# Patient Record
Sex: Male | Born: 1945 | Race: Black or African American | Hispanic: No | Marital: Married | Smoking: Never smoker
Health system: Southern US, Community
[De-identification: ages and names within clinical notes are randomized; demographics above are authoritative.]

## PROBLEM LIST (undated history)

## (undated) DIAGNOSIS — N189 Chronic kidney disease, unspecified: Secondary | ICD-10-CM

## (undated) DIAGNOSIS — E785 Hyperlipidemia, unspecified: Secondary | ICD-10-CM

## (undated) DIAGNOSIS — C801 Malignant (primary) neoplasm, unspecified: Secondary | ICD-10-CM

## (undated) DIAGNOSIS — Z940 Kidney transplant status: Secondary | ICD-10-CM

## (undated) DIAGNOSIS — I10 Essential (primary) hypertension: Secondary | ICD-10-CM

## (undated) DIAGNOSIS — M1990 Unspecified osteoarthritis, unspecified site: Secondary | ICD-10-CM

## (undated) DIAGNOSIS — G4730 Sleep apnea, unspecified: Secondary | ICD-10-CM

## (undated) HISTORY — DX: Essential (primary) hypertension: I10

## (undated) HISTORY — DX: Hyperlipidemia, unspecified: E78.5

## (undated) HISTORY — DX: Chronic kidney disease, unspecified: N18.9

## (undated) HISTORY — PX: NEPHRECTOMY TRANSPLANTED ORGAN: SUR880

---

## 2000-10-14 DIAGNOSIS — R809 Proteinuria, unspecified: Secondary | ICD-10-CM | POA: Insufficient documentation

## 2007-03-20 ENCOUNTER — Other Ambulatory Visit: Payer: Self-pay

## 2007-03-20 ENCOUNTER — Emergency Department: Payer: Self-pay | Admitting: Unknown Physician Specialty

## 2008-03-26 ENCOUNTER — Other Ambulatory Visit: Payer: Self-pay

## 2008-03-26 ENCOUNTER — Ambulatory Visit: Payer: Self-pay | Admitting: Ophthalmology

## 2008-03-31 ENCOUNTER — Ambulatory Visit: Payer: Self-pay | Admitting: Ophthalmology

## 2009-08-13 ENCOUNTER — Ambulatory Visit: Payer: Self-pay | Admitting: Radiation Oncology

## 2009-09-02 ENCOUNTER — Ambulatory Visit: Payer: Self-pay | Admitting: Specialist

## 2009-09-05 ENCOUNTER — Ambulatory Visit: Payer: Self-pay | Admitting: Specialist

## 2009-09-12 ENCOUNTER — Ambulatory Visit: Payer: Self-pay | Admitting: Radiation Oncology

## 2009-09-13 ENCOUNTER — Ambulatory Visit: Payer: Self-pay | Admitting: Radiation Oncology

## 2009-09-14 ENCOUNTER — Ambulatory Visit: Payer: Self-pay | Admitting: Radiation Oncology

## 2009-10-11 ENCOUNTER — Ambulatory Visit: Payer: Self-pay | Admitting: Radiation Oncology

## 2009-11-11 ENCOUNTER — Ambulatory Visit: Payer: Self-pay | Admitting: Radiation Oncology

## 2009-12-11 ENCOUNTER — Ambulatory Visit: Payer: Self-pay | Admitting: Radiation Oncology

## 2010-01-11 ENCOUNTER — Ambulatory Visit: Payer: Self-pay | Admitting: Radiation Oncology

## 2010-03-13 ENCOUNTER — Ambulatory Visit: Payer: Self-pay | Admitting: Radiation Oncology

## 2010-04-12 ENCOUNTER — Ambulatory Visit: Payer: Self-pay | Admitting: Radiation Oncology

## 2010-04-13 ENCOUNTER — Ambulatory Visit: Payer: Self-pay | Admitting: Radiation Oncology

## 2010-04-13 LAB — PSA

## 2011-01-03 DIAGNOSIS — N186 End stage renal disease: Secondary | ICD-10-CM | POA: Insufficient documentation

## 2011-03-21 DIAGNOSIS — E042 Nontoxic multinodular goiter: Secondary | ICD-10-CM | POA: Insufficient documentation

## 2011-05-25 DIAGNOSIS — K5901 Slow transit constipation: Secondary | ICD-10-CM | POA: Insufficient documentation

## 2012-09-29 DIAGNOSIS — Z905 Acquired absence of kidney: Secondary | ICD-10-CM | POA: Insufficient documentation

## 2012-09-29 DIAGNOSIS — I770 Arteriovenous fistula, acquired: Secondary | ICD-10-CM | POA: Insufficient documentation

## 2012-09-29 DIAGNOSIS — N051 Unspecified nephritic syndrome with focal and segmental glomerular lesions: Secondary | ICD-10-CM | POA: Insufficient documentation

## 2012-09-29 DIAGNOSIS — C61 Malignant neoplasm of prostate: Secondary | ICD-10-CM | POA: Insufficient documentation

## 2013-04-30 DIAGNOSIS — I716 Thoracoabdominal aortic aneurysm, without rupture, unspecified: Secondary | ICD-10-CM | POA: Insufficient documentation

## 2013-05-25 DIAGNOSIS — Z8546 Personal history of malignant neoplasm of prostate: Secondary | ICD-10-CM | POA: Insufficient documentation

## 2013-12-11 DIAGNOSIS — D899 Disorder involving the immune mechanism, unspecified: Secondary | ICD-10-CM

## 2013-12-11 DIAGNOSIS — D849 Immunodeficiency, unspecified: Secondary | ICD-10-CM | POA: Insufficient documentation

## 2014-01-25 DIAGNOSIS — E876 Hypokalemia: Secondary | ICD-10-CM | POA: Insufficient documentation

## 2014-03-08 DIAGNOSIS — Z79899 Other long term (current) drug therapy: Secondary | ICD-10-CM | POA: Insufficient documentation

## 2014-03-08 DIAGNOSIS — Z940 Kidney transplant status: Secondary | ICD-10-CM

## 2014-05-03 DIAGNOSIS — R3129 Other microscopic hematuria: Secondary | ICD-10-CM | POA: Insufficient documentation

## 2014-05-10 DIAGNOSIS — E663 Overweight: Secondary | ICD-10-CM | POA: Insufficient documentation

## 2014-05-10 DIAGNOSIS — D631 Anemia in chronic kidney disease: Secondary | ICD-10-CM | POA: Insufficient documentation

## 2014-05-10 DIAGNOSIS — N189 Chronic kidney disease, unspecified: Secondary | ICD-10-CM

## 2014-06-08 DIAGNOSIS — I151 Hypertension secondary to other renal disorders: Secondary | ICD-10-CM | POA: Insufficient documentation

## 2015-11-30 DIAGNOSIS — Z4822 Encounter for aftercare following kidney transplant: Secondary | ICD-10-CM | POA: Insufficient documentation

## 2016-06-11 DIAGNOSIS — I214 Non-ST elevation (NSTEMI) myocardial infarction: Secondary | ICD-10-CM | POA: Insufficient documentation

## 2016-06-11 DIAGNOSIS — I3139 Other pericardial effusion (noninflammatory): Secondary | ICD-10-CM | POA: Insufficient documentation

## 2016-06-11 DIAGNOSIS — R0902 Hypoxemia: Secondary | ICD-10-CM | POA: Insufficient documentation

## 2016-06-11 DIAGNOSIS — I313 Pericardial effusion (noninflammatory): Secondary | ICD-10-CM | POA: Insufficient documentation

## 2016-11-29 DIAGNOSIS — R0609 Other forms of dyspnea: Secondary | ICD-10-CM | POA: Insufficient documentation

## 2017-03-18 DIAGNOSIS — E7800 Pure hypercholesterolemia, unspecified: Secondary | ICD-10-CM | POA: Insufficient documentation

## 2017-03-18 DIAGNOSIS — D638 Anemia in other chronic diseases classified elsewhere: Secondary | ICD-10-CM | POA: Insufficient documentation

## 2017-03-18 DIAGNOSIS — E538 Deficiency of other specified B group vitamins: Secondary | ICD-10-CM | POA: Insufficient documentation

## 2017-03-18 DIAGNOSIS — E46 Unspecified protein-calorie malnutrition: Secondary | ICD-10-CM | POA: Insufficient documentation

## 2017-05-09 ENCOUNTER — Other Ambulatory Visit: Payer: Self-pay | Admitting: Unknown Physician Specialty

## 2017-05-09 DIAGNOSIS — R490 Dysphonia: Secondary | ICD-10-CM

## 2017-06-03 ENCOUNTER — Ambulatory Visit
Admission: RE | Admit: 2017-06-03 | Discharge: 2017-06-03 | Disposition: A | Discharge status: Home / Self Care | Payer: Medicare Other | Source: Ambulatory Visit | Attending: Unknown Physician Specialty | Admitting: Unknown Physician Specialty

## 2017-06-03 DIAGNOSIS — R490 Dysphonia: Secondary | ICD-10-CM | POA: Diagnosis not present

## 2017-06-03 DIAGNOSIS — R1312 Dysphagia, oropharyngeal phase: Secondary | ICD-10-CM

## 2017-06-03 NOTE — Therapy (Signed)
[place], [place], [address] Phone: [phone]   Fax:     Modified Barium Swallow  Patient Details  Name: Jay Mora MRN: [id number] Date of Birth: Jan 08, 1946 No Data Recorded  Encounter Date: 2017-06-23      End of Session - June 23, 2017 1318    Visit Number 1   Number of Visits 1   Date for SLP Re-Evaluation 06-23-2017   SLP Start Time 4742   SLP Stop Time  1315   SLP Time Calculation (min) 60 min   Activity Tolerance Patient tolerated treatment well      No past medical history on file.  No past surgical history on file.  There were no vitals filed for this visit.     Subjective: Patient behavior: (alertness, ability to follow instructions, etc.): Patient is able to follow directions and explain his swallowing complaints  Chief complaint: shortness of breath, episodes of liquid "going into the windpipe"    Objective:  Radiological Procedure: A videoflouroscopic evaluation of oral-preparatory, reflex initiation, and pharyngeal phases of the swallow was performed; as well as a screening of the upper esophageal phase.  I. POSTURE: Upright in MBS chair  II. VIEW: Lateral  III. COMPENSATORY STRATEGIES: N/A  IV. BOLUSES ADMINISTERED:   Thin Liquid: 2 cup rim, 4 rapid consecutive   Nectar-thick Liquid: 1 moderate   Honey-thick Liquid: DNT   Puree: 2 teaspoon presentations   Mechanical Soft: 1/4 graham cracker in applesauce  V. RESULTS OF EVALUATION: A. ORAL PREPARATORY PHASE: (The lips, tongue, and velum are observed for strength and coordination)       **Overall Severity Rating: Within normal limits  B. SWALLOW INITIATION/REFLEX: (The reflex is normal if "triggered" by the time the bolus reached the base of the tongue)  **Overall Severity Rating: Mild; triggers at the valleculae  C. PHARYNGEAL PHASE: (Pharyngeal function is normal if the bolus shows rapid, smooth, and  continuous transit through the pharynx and there is no pharyngeal residue after the swallow)  **Overall Severity Rating: Within normal limits  D. LARYNGEAL PENETRATION: (Material entering into the laryngeal inlet/vestibule but not aspirated) None  E. ASPIRATION: None  F. ESOPHAGEAL PHASE: (Screening of the upper esophagus) No observed abnormality within the viewable cervical esophagus  ASSESSMENT: This 71 year old man; with concern for aspiration; is presenting with minimal oropharyngeal dysphagia characterized by mildly delayed pharyngeal swallow initiation.  Oral control of the bolus including oral hold, rotary mastication, and anterior to posterior transfer are within normal limits.  Aspects of the pharyngeal stage of swallowing including tongue base retraction, hyolaryngeal excursion, epiglottic inversion, and duration/amplitude of UES opening are within normal limits.  There is no observed pharyngeal residue, laryngeal penetration, or tracheal aspiration.  The patient's difficulties do not appear to be due to oropharyngeal swallow function.    PLAN/RECOMMENDATIONS:   A. Diet: Regular/usual diet   B. Swallowing Precautions: No special precautions   C. Recommended consultation to: follow up with physicians as recommended   D. Therapy recommendations: N/A speech therapy is not indicated   E. Results and recommendations were discussed with the patient immediately following the study and the final report routed to the referring MD.   Dysphagia, oropharyngeal phase  Hoarseness - Plan: DG OP Swallowing Func-Medicare/Speech Path, DG OP Swallowing Func-Medicare/Speech Path      G-Codes - 2017-06-23 1319    Functional Assessment Tool Used MBSS, clinical judgment   Functional Limitations Swallowing   Swallow Current  Status [phone]) At least 1 percent but less than 20 percent impaired, limited or restricted   Swallow Goal Status (J5872) At least 1 percent but less than 20 percent impaired,  limited or restricted   Swallow Discharge Status [phone]) At least 1 percent but less than 20 percent impaired, limited or restricted          Problem List There are no active problems to display for this patient.  Jay Sea, MS/CCC- SLP  Jay Mora 06/03/2017, 1:20 PM  [facility] [place], [place], [address] Phone: [phone]   Fax:     Name: Jay Mora MRN: [id number] Date of Birth: [birth year]

## 2018-03-31 DIAGNOSIS — G4733 Obstructive sleep apnea (adult) (pediatric): Secondary | ICD-10-CM | POA: Insufficient documentation

## 2018-05-02 ENCOUNTER — Encounter: Payer: Self-pay | Admitting: *Deleted

## 2018-05-05 ENCOUNTER — Emergency Department: Payer: Medicare Other

## 2018-05-05 ENCOUNTER — Other Ambulatory Visit: Payer: Self-pay

## 2018-05-05 ENCOUNTER — Ambulatory Visit (HOSPITAL_COMMUNITY)
Admission: AD | Admit: 2018-05-05 | Discharge: 2018-05-05 | Disposition: A | Discharge status: Home / Self Care | Payer: Medicare Other | Source: Other Acute Inpatient Hospital | Attending: Emergency Medicine | Admitting: Emergency Medicine

## 2018-05-05 ENCOUNTER — Encounter: Payer: Self-pay | Admitting: Emergency Medicine

## 2018-05-05 ENCOUNTER — Ambulatory Visit (INDEPENDENT_AMBULATORY_CARE_PROVIDER_SITE_OTHER): Payer: Medicare Other | Admitting: Cardiothoracic Surgery

## 2018-05-05 ENCOUNTER — Encounter: Payer: Self-pay | Admitting: Cardiothoracic Surgery

## 2018-05-05 ENCOUNTER — Emergency Department
Admission: EM | Admit: 2018-05-05 | Discharge: 2018-05-05 | Disposition: A | Discharge status: Other Acute Inpatient Hospital | Payer: Medicare Other | Attending: Emergency Medicine | Admitting: Emergency Medicine

## 2018-05-05 VITALS — BP 200/100 | HR 64 | Temp 97.7°F | Ht 70.0 in | Wt 182.6 lb

## 2018-05-05 DIAGNOSIS — Z8546 Personal history of malignant neoplasm of prostate: Secondary | ICD-10-CM | POA: Diagnosis not present

## 2018-05-05 DIAGNOSIS — I313 Pericardial effusion (noninflammatory): Secondary | ICD-10-CM | POA: Insufficient documentation

## 2018-05-05 DIAGNOSIS — I132 Hypertensive heart and chronic kidney disease with heart failure and with stage 5 chronic kidney disease, or end stage renal disease: Secondary | ICD-10-CM | POA: Diagnosis not present

## 2018-05-05 DIAGNOSIS — J9601 Acute respiratory failure with hypoxia: Secondary | ICD-10-CM

## 2018-05-05 DIAGNOSIS — R0602 Shortness of breath: Secondary | ICD-10-CM | POA: Diagnosis present

## 2018-05-05 DIAGNOSIS — R0902 Hypoxemia: Secondary | ICD-10-CM

## 2018-05-05 DIAGNOSIS — I7103 Dissection of thoracoabdominal aorta: Secondary | ICD-10-CM | POA: Diagnosis not present

## 2018-05-05 DIAGNOSIS — I3139 Other pericardial effusion (noninflammatory): Secondary | ICD-10-CM

## 2018-05-05 DIAGNOSIS — I509 Heart failure, unspecified: Secondary | ICD-10-CM | POA: Insufficient documentation

## 2018-05-05 DIAGNOSIS — N186 End stage renal disease: Secondary | ICD-10-CM | POA: Insufficient documentation

## 2018-05-05 DIAGNOSIS — Z940 Kidney transplant status: Secondary | ICD-10-CM | POA: Insufficient documentation

## 2018-05-05 HISTORY — DX: Kidney transplant status: Z94.0

## 2018-05-05 LAB — CBC
HEMATOCRIT: 40.6 % (ref 40.0–52.0)
Hemoglobin: 13.1 g/dL (ref 13.0–18.0)
MCH: 22.9 pg — ABNORMAL LOW (ref 26.0–34.0)
MCHC: 32.4 g/dL (ref 32.0–36.0)
MCV: 70.6 fL — AB (ref 80.0–100.0)
PLATELETS: 171 10*3/uL (ref 150–440)
RBC: 5.74 MIL/uL (ref 4.40–5.90)
RDW: 17.6 % — AB (ref 11.5–14.5)
WBC: 5 10*3/uL (ref 3.8–10.6)

## 2018-05-05 LAB — BASIC METABOLIC PANEL
Anion gap: 5 (ref 5–15)
BUN: 22 mg/dL (ref 8–23)
CHLORIDE: 114 mmol/L — AB (ref 98–111)
CO2: 25 mmol/L (ref 22–32)
Calcium: 9.3 mg/dL (ref 8.9–10.3)
Creatinine, Ser: 1.17 mg/dL (ref 0.61–1.24)
GFR calc Af Amer: >60 mL/min (ref >60)
GLUCOSE: 99 mg/dL (ref 70–99)
POTASSIUM: 3.7 mmol/L (ref 3.5–5.1)
SODIUM: 144 mmol/L (ref 135–145)

## 2018-05-05 LAB — TROPONIN I
Troponin I: 0.03 ng/mL (ref <0.03)
Troponin I: 0.04 ng/mL (ref <0.03)
Troponin I: 0.04 ng/mL (ref <0.03)

## 2018-05-05 LAB — BRAIN NATRIURETIC PEPTIDE: B Natriuretic Peptide: 4268 pg/mL — ABNORMAL HIGH (ref 0.0–100.0)

## 2018-05-05 MED ORDER — IOHEXOL 350 MG/ML SOLN
75.0000 mL | Freq: Once | INTRAVENOUS | Status: AC | PRN
  Administered 2018-05-05: 75 mL via INTRAVENOUS

## 2018-05-05 MED ORDER — SODIUM CHLORIDE 0.9 % IV BOLUS: 500.0000 mL | Freq: Once | INTRAVENOUS | Status: DC

## 2018-05-05 MED ORDER — FUROSEMIDE 10 MG/ML IJ SOLN
40.0000 mg | Freq: Once | INTRAMUSCULAR | Status: AC
  Administered 2018-05-05: 40 mg via INTRAVENOUS
  Filled 2018-05-05: qty 4

## 2018-05-05 MED ORDER — TACROLIMUS 1 MG PO CAPS
1.0000 mg | ORAL_CAPSULE | Freq: Two times a day (BID) | ORAL | Status: DC
  Administered 2018-05-05: 1 mg via ORAL
  Filled 2018-05-05: qty 1

## 2018-05-05 MED ORDER — LABETALOL HCL 5 MG/ML IV SOLN
10.0000 mg | Freq: Once | INTRAVENOUS | Status: AC
  Administered 2018-05-05: 10 mg via INTRAVENOUS
  Filled 2018-05-05: qty 4

## 2018-05-05 MED ORDER — SODIUM CHLORIDE 0.9 % IV BOLUS
500.0000 mL | Freq: Once | INTRAVENOUS | Status: AC
  Administered 2018-05-05: 500 mL via INTRAVENOUS

## 2018-05-05 MED ORDER — MYCOPHENOLATE SODIUM 180 MG PO TBEC
180.0000 mg | DELAYED_RELEASE_TABLET | Freq: Once | ORAL | Status: AC
  Administered 2018-05-05: 180 mg via ORAL
  Filled 2018-05-05: qty 1

## 2018-05-05 MED ORDER — ESMOLOL HCL-SODIUM CHLORIDE 2000 MG/100ML IV SOLN
25.0000 ug/kg/min | Freq: Once | INTRAVENOUS | Status: AC
  Administered 2018-05-05: 25 ug/kg/min via INTRAVENOUS
  Filled 2018-05-05: qty 100

## 2018-05-05 NOTE — ED Notes (Signed)
Called [facility] transfer center spoke to [place]

## 2018-05-05 NOTE — ED Notes (Signed)
Pt is resting in bed. Family at bedside.

## 2018-05-05 NOTE — Progress Notes (Signed)
Patient ID: Jay Mora, male   DOB: [birth year], 72 y.o.   MRN: [id number]  Chief Complaint  Patient presents with  . New Patient (Initial Visit)    Pericardial effusion    Referred By Dr. Ginette Pitman Reason for Referral pericardial effusion  HPI Location, Quality, Duration, Severity, Timing, Context, Modifying Factors, Associated Signs and Symptoms.  Jay Mora is a 72 y.o. male.  He is a 72 year old African-American male who is 4 years out from a right iliac fossa cadaveric kidney transplant at [facility]  He receives most of his care at [facility] at the nephrology department and has undergone bilateral nephrectomies for hypertension.  The patient has been disappointed with the care that he is received at various locations not because of quality but because of inconvenience and he was like to concentrate his care here at [facility].  As such he would like to have his records forwarded to [place].  In our system there is little information available for my review.  There are no imaging studies including echocardiography or CT scanning.  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  He does not get short of breath although he has been noticing increasing bilateral lower extremity edema.  This is been going on for several months.  He is not short of breath when he lies flat.  Apparently the patient had a history of a pericardial effusion although I am not sure how that diagnosis was made and neither is the patient.   Past Medical History:  Diagnosis Date  . Chronic kidney disease   . Hyperlipidemia   . Hypertension      No family history on file.  Social History Social History   Tobacco Use  . Smoking status: Never Smoker  . Smokeless tobacco: Never Used  Substance Use Topics  . Alcohol use: Not Currently  . Drug use: Never     Allergies  Allergen Reactions  . Shellfish Allergy Swelling    Other reaction(s): SWELLING Other reaction(s): SWELLING   . Minoxidil Nausea Only, Other (See Comments) and Rash    Makes patient really tired per his reports. Extreme fatigue Makes patient really tired per his reports. Extreme fatigue     Current Outpatient Medications  Medication Sig Dispense Refill  . Cholecalciferol 1000 units capsule Take by mouth.    . ferrous sulfate 325 (65 FE) MG tablet Take by mouth.    Marland Kitchen albuterol (PROVENTIL HFA;VENTOLIN HFA) 108 (90 Base) MCG/ACT inhaler Inhale into the lungs.    Marland Kitchen aspirin EC 81 MG tablet Take by mouth.    Marland Kitchen atorvastatin (LIPITOR) 20 MG tablet Take by mouth.    Marland Kitchen atorvastatin (LIPITOR) 40 MG tablet Take by mouth.    . cloNIDine (CATAPRES - DOSED IN MG/24 HR) 0.3 mg/24hr patch Place onto the skin.    Marland Kitchen docusate sodium (COLACE) 100 MG capsule Take by mouth.    . furosemide (LASIX) 40 MG tablet Take by mouth.    . hydrALAZINE (APRESOLINE) 100 MG tablet Take by mouth.    . isosorbide mononitrate (IMDUR) 60 MG 24 hr tablet Take by mouth.    . labetalol (NORMODYNE) 100 MG tablet Take by mouth.    Marland Kitchen lisinopril (PRINIVIL,ZESTRIL) 20 MG tablet Take by mouth.    Marland Kitchen lisinopril (PRINIVIL,ZESTRIL) 40 MG tablet Take by mouth.    Marland Kitchen  mycophenolate (MYFORTIC) 180 MG EC tablet Take by mouth.    . polyethylene glycol powder (GLYCOLAX/MIRALAX) powder Take by mouth.    . potassium chloride (K-DUR) 10 MEQ tablet Take by mouth.    . potassium chloride SA (K-DUR,KLOR-CON) 20 MEQ tablet Take by mouth.    . sertraline (ZOLOFT) 25 MG tablet Take by mouth.    . tacrolimus (PROGRAF) 1 MG capsule Take by mouth.    . vitamin B-12 (CYANOCOBALAMIN) 1000 MCG tablet Take by mouth.     No current facility-administered medications for this visit.       Review of Systems A complete review of systems was asked and was negative except for the following positive findings weight loss, loss of sleep,  fatigue, shortness of breath with exertion.  Blood pressure (!) 200/100, pulse 64, temperature 97.7 F (36.5 C), temperature source Skin, height 5\' 10"  (1.778 m), weight 182 lb 9.6 oz (82.8 kg), SpO2 (!) 84 %.  Physical Exam CONSTITUTIONAL:  Pleasant, well-developed, well-nourished, and in no acute distress. EYES: Pupils equal and reactive to light, Sclera non-icteric EARS, NOSE, MOUTH AND THROAT:  The oropharynx was clear.  Dentition is good repair.  Oral mucosa pink and moist. LYMPH NODES:  Lymph nodes in the neck and axillae were normal RESPIRATORY:  Lungs were clear.  Normal respiratory effort without pathologic use of accessory muscles of respiration CARDIOVASCULAR: Heart was regular without murmurs.  There were no carotid bruits.  Heart sounds were crisp.   GI: The abdomen was soft, nontender, and nondistended. There were no palpable masses. There was no hepatosplenomegaly. There were normal bowel sounds in all quadrants. GU:  Rectal deferred.   MUSCULOSKELETAL:  Normal muscle strength and tone.  No clubbing or cyanosis.  There is extensive lower extremity edema.  There is some distention of the external jugular veins but in the presence of the fistulae is unclear the significance of this. SKIN:  There were no pathologic skin lesions.  There were no nodules on palpation. NEUROLOGIC:  Sensation is normal.  Cranial nerves are grossly intact. PSYCH:  Oriented to person, place and time.  Mood and affect are normal.  Data Reviewed None  I have personally reviewed the patient's imaging, laboratory findings and medical records.    Assessment    During our evaluation of the patient his oxygen saturations were recorded at 84 to 85%.  I explained this to the patient and he stated that he was trying to be calm and not breathe deeply because of his hypertension which she wished to be controlled.  He then made several vigorous attempts at coughing and deep breathing and indeed his saturations did  improve into the mid 90s but over the course of the next minute or 2 fell back into the low 90s.  I did discuss his care with Dr. Gustavus Bryant today.  I explained that I did not have any information to work with regarding echocardiography or CT scanning but that the patient appeared to be clinically well.    Plan    After an extensive discussion with Dr. Gustavus Bryant and the patient I have recommended that he present to the emergency room for further evaluation.  I am concerned that his oxygen saturations cannot be maintained higher than 90%.  The patient tells me that his wife is present in the car with him and cannot come in nor can she drive home as she has had recent foot surgery.  Therefore he is going to leave our office despite our  recommendations.  I did talk to Dr. Gustavus Bryant today and he recommended perhaps getting social services involved but the patient has requested to leave.  He will present to our emergency department later after he takes his wife home.  Perhaps we can then evaluate his underlying issues.      Nestor Lewandowsky, MD 05/05/2018, 10:43 AM

## 2018-05-05 NOTE — ED Notes (Signed)
Report off to brittnay rn

## 2018-05-05 NOTE — ED Notes (Signed)
Pt is going to medial imaging.

## 2018-05-05 NOTE — Patient Instructions (Addendum)
We recommend for you to go to the ED after dropping off your wife.  We will gather most of your records for [place] to review. Please sign release of information at the front so we could gather your information.

## 2018-05-05 NOTE — ED Notes (Signed)
EMTALA and Medical Necessity documentation reviewed at this time and complete per policy.

## 2018-05-05 NOTE — ED Notes (Signed)
[facility] is here to transfer patient to [facility].

## 2018-05-05 NOTE — ED Notes (Signed)
AAOx3.  Skin warm and dry.  No SOB/ DOE.   

## 2018-05-05 NOTE — ED Triage Notes (Signed)
Has been short of breath for months.  Says also increased swllling in legs.  He appeared short of breat after walking in.  Better at rest.  Was sent from dr office and t hey wanted to send ems, but pt refused.

## 2018-05-05 NOTE — ED Notes (Addendum)
Pt was sent from doctor's office with low oxygen sats.  Oxygen sats in the 8-0's on room air.  Pt placed on 3 liters with sats 94%.  Pt reports no chest pain.  States intermittent sob.  Hx dialysis and kidney transplant. Blood pressure elevated.  Pt took bp meds today.   No n/v/d  Pt alert.  nsr on monitor with occ pvc's

## 2018-05-05 NOTE — ED Notes (Signed)
ED Provider at bedside. 

## 2018-05-05 NOTE — ED Provider Notes (Addendum)
[facility] Emergency Department Provider Note  ____________________________________________  Time seen: Approximately 6:39 PM  I have reviewed the triage vital signs and the nursing notes.   HISTORY  Chief Complaint Shortness of Breath    HPI Jay Mora is a 72 y.o. male status post renal transplant 2015, HTN, HL, known pericardial effusion, presenting for hypoxia.  The patient reports that for the last several years he has had progressively worsening shortness of breath, especially with exertion.  Over the last 3 weeks, he has also noted new lower extremity edema that has been treated with Lasix although he continues to have swelling.  ----------------------------------------- 6:56 PM on 05/05/2018 -----------------------------------------  I reviewed the patient's medical chart at [facility], and it appears that he has had a known moderate to large pericardial effusion for years, with prior echocardiogram showing large effusion without tamponade physiology.  Last echocardiogram was performed 5/19.   Past Medical History:  Diagnosis Date  . Chronic kidney disease   . Hyperlipidemia   . Hypertension   . Kidney transplanted     Patient Active Problem List   Diagnosis Date Noted  . Obstructive sleep apnea 03/31/2018  . Pure hypercholesterolemia 03/18/2017  . Protein deficiency ([place]) 03/18/2017  . Low serum vitamin B12 03/18/2017  . Anemia of chronic disease 03/18/2017  . DOE (dyspnea on exertion) 11/29/2016  . Pericardial effusion 06/11/2016  . NSTEMI (non-ST elevated myocardial infarction) ([place]) 06/11/2016  . Hypoxia 06/11/2016  . Encounter for aftercare following kidney transplant 11/30/2015  . 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 Overweight (BMI 25.0-29.9) 05/10/2014  . Anemia of renal disease 05/10/2014  . Hematuria, microscopic 05/03/2014  . Immunosuppressive management encounter following kidney transplant 03/08/2014  .  Hypomagnesemia 01/25/2014  . Chronic hypokalemia 01/25/2014  . Hypophosphatemia 12/22/2013  . Immunosuppression ([place]) 12/11/2013  . Personal history of prostate cancer 05/25/2013  . 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 History of nephrectomy 09/29/2012  . 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 CA of prostate ([place]) 09/29/2012  . Arteriovenous fistula ([place]) 09/29/2012  . Slow transit constipation 05/25/2011  . 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 End-stage renal disease ([place]) 01/03/2011  . 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 Proteinuria 10/14/2000  . Benign essential hypertension 10/14/2000    Past Surgical History:  Procedure Laterality Date  . NEPHRECTOMY TRANSPLANTED ORGAN      Current Outpatient Rx  . Order #: [id number] Class: Historical Med  . Order #: [id number] Class: Historical Med  . Order #: [id number] Class: Historical Med  . Order #: [id number] Class: Historical Med  . Order #: [id number] Class: Historical Med  . Order #: [id number] Class: Historical Med  . Order #: [id number] Class: Historical Med  . Order #: [id number] Class: Historical Med  . Order #: [id number] Class: Historical Med  . Order #: [id number] Class: Historical Med  . Order #: [id number] Class: Historical Med  . Order #: [id number] Class: Historical Med  . Order #: [id number] Class: Historical Med  . Order #: [id number] Class: Historical Med  . Order #: [id number] Class: Historical Med  . Order #: [id number] Class: Historical Med  . Order #: [id number] Class: Historical Med  . Order #: [id number] Class: Historical Med  . Order #: [id number] Class: Historical Med  . Order #: [id number] Class: Historical Med  . Order #: [id number] Class: Historical Med    Allergies Shellfish allergy and Minoxidil  No family history on file.  Social History Social History   Tobacco Use  . Smoking status: Never Smoker  . Smokeless tobacco: Never Used  Substance  Use Topics  . Alcohol  use: Not Currently  . Drug use: Never    Review of Systems Constitutional: No fever/chills.  No lightheadedness or syncope.  Positive decreased exercise tolerance. Eyes: No visual changes. ENT: No sore throat. No congestion or rhinorrhea. Cardiovascular: Denies chest pain. Denies palpitations. Respiratory: Positive hypoxia with shortness of breath, worse with exertion..  No cough. Gastrointestinal: No abdominal pain.  No nausea, no vomiting.  No diarrhea.  No constipation. Genitourinary: Negative for dysuria. Musculoskeletal: Negative for back pain.  Positive bilateral lower extremity swelling. Skin: Negative for rash. Neurological: Negative for headaches. No focal numbness, tingling or weakness.     ____________________________________________   PHYSICAL EXAM:  VITAL SIGNS: ED Triage Vitals  Enc Vitals Group     BP 05/05/18 1343 (!) 180/80     Pulse Rate 05/05/18 1335 68     Resp 05/05/18 1738 18     Temp 05/05/18 1335 97.8 F (36.6 C)     Temp Source 05/05/18 1738 Oral     SpO2 05/05/18 1335 93 %     Weight --      Height --      Head Circumference --      Peak Flow --      Pain Score 05/05/18 1344 0     Pain Loc --      Pain Edu? --      Excl. in [place]? --     Constitutional: Alert and oriented.  Answers questions appropriately.  Chronically ill-appearing. Eyes: Conjunctivae are normal.  EOMI. No scleral icterus. Head: Atraumatic. Nose: No congestion/rhinnorhea. Mouth/Throat: Mucous membranes are moist.  Neck: No stridor.  Supple.  Positive JVD.  No meningismus. Cardiovascular: Normal rate, regular rhythm.  Holosystolic murmur, without rubs or gallops.  Respiratory: Mildly tachypneic without accessory muscle use or retractions.  O2 sats are in the high 80s on room air on my examination; he does have O2 sats in the mid 90s when I placed his oxygen back at 3-1/2 L nasal cannula.  There are no wheezes rales or rhonchi on my exam.  The patient is able to speak in 3-4  word sentences and is winded after that. Gastrointestinal: Soft, nontender and nondistended.  No guarding or rebound.  No peritoneal signs. Musculoskeletal: Positive bilateral symmetric LE edema. No ttp in the calves or palpable cords.  Negative Homan's sign. Neurologic:  A&Ox3.  Speech is clear.  Face and smile are symmetric.  EOMI.  Moves all extremities well. Skin:  Skin is warm, dry and intact. No rash noted. Psychiatric: Mood and affect are normal. Speech and behavior are normal.  Normal judgement.  ____________________________________________   LABS (all labs ordered are listed, but only abnormal results are displayed)  Labs Reviewed  BASIC METABOLIC PANEL - Abnormal; Notable for the following components:      Result Value   Chloride 114 (*)    All other components within normal limits  CBC - Abnormal; Notable for the following components:   MCV 70.6 (*)    MCH 22.9 (*)    RDW 17.6 (*)    All other components within normal limits  TROPONIN I - Abnormal; Notable for the following components:   Troponin I 0.03 (*)    All other components within normal limits  TROPONIN I - Abnormal; Notable for the following components:   Troponin I 0.04 (*)    All other components within normal limits  BRAIN NATRIURETIC PEPTIDE - Abnormal; Notable for the following components:  B Natriuretic Peptide 4,268.0 (*)    All other components within normal limits   ____________________________________________  EKG  ED ECG REPORT I, Anne-Caroline Mariea Clonts, the attending physician, personally viewed and interpreted this ECG.   Date: 05/05/2018  EKG Time: 1344  Rate: 81  Rhythm: normal sinus rhythm  Axis: leftward  Intervals:first-degree A-V block   ST&T Change: No STEMI; no findings consistent with tamponade.  ____________________________________________  RADIOLOGY  Dg Chest 2 View  Result Date: 05/05/2018 CLINICAL DATA:  Shortness of breath one month.  Kidney transplant. EXAM: CHEST - 2  VIEW COMPARISON:  03/20/2007 FINDINGS: Lungs are adequately inflated without lobar consolidation or effusion. Mild prominence of the perihilar markings. Moderate stable cardiomegaly. Remainder of the exam is unchanged. IMPRESSION: Moderate stable cardiomegaly with minimal vascular congestion. Electronically Signed   By: Marin Olp M.D.   On: 05/05/2018 15:22   Ct Angio Chest Pe W And/or Wo Contrast  Result Date: 05/05/2018 CLINICAL DATA:  Short of breath for several months with increased leg swelling. Previous kidney transplant and prostate cancer. EXAM: CT ANGIOGRAPHY CHEST WITH CONTRAST TECHNIQUE: Multidetector CT imaging of the chest was performed using the standard protocol during bolus administration of intravenous contrast. Multiplanar CT image reconstructions and MIPs were obtained to evaluate the vascular anatomy. CONTRAST:  43mL OMNIPAQUE IOHEXOL 350 MG/ML SOLN COMPARISON:  Noncontrast abdominal CT 03/20/2007 FINDINGS: Cardiovascular: Heart is enlarged with moderate size pericardial effusion with Hounsfield unit measurements of 17. There is mild aneurysmal dilatation of the ascending thoracic aorta measuring 4.5 cm in diameter. There is a dissection of the thoracic aorta beginning just after the takeoff of the left subclavian artery and extending distally into the abdomen to the most inferior image as this is incompletely evaluated. The distal arch measures 5.6 cm in diameter. The true lumen remains opacified into the upper abdomen. Pulmonary arterial system is well opacified without evidence of emboli. There is calcified plaque over the left anterior descending coronary artery and right coronary artery. Calcified plaque over the thoracic aorta. Mediastinum/Nodes: No mediastinal or hilar adenopathy. Remaining mediastinal structures are unremarkable. Lungs/Pleura: Lungs are adequately inflated demonstrate subtle bibasilar atelectasis. There are small bilateral pleural effusions right greater than  left. Airways are normal. Upper Abdomen: Aortic dissection continues into the abdomen with opacification of the true lumen which feeds the celiac axis and superior mesenteric arteries which are well opacified. Calcified plaque over the abdominal aorta. Surgical clips over the right renal fossa. Musculoskeletal: Degenerative change of the spine. Review of the MIP images confirms the above findings. IMPRESSION: Thoracic aortic dissection beginning just after the takeoff of the left subclavian artery and extending into the abdomen to the most inferior image as the abdominal extent is not completely evaluated. There is opacification of the true lumen which feeds the SMA and celiac axis. Aneurysmal dilatation of the ascending thoracic aorta measuring 4.5 cm in diameter. Dilatation of the distal arch at the dissection measuring 5.6 cm in diameter. Cardiomegaly with moderate size pericardial effusion. No evidence of pulmonary embolism. Small bilateral pleural effusions right greater than left with minimal bibasilar atelectasis. Atherosclerotic coronary artery disease. Aortic Atherosclerosis (ICD10-I70.0). Critical Value/emergent results were called by telephone at the time of interpretation on 05/05/2018 at 8:19 p.m. to Dr. Eula Listen , who verbally acknowledged these results. Electronically Signed   By: Marin Olp M.D.   On: 05/05/2018 20:27    ____________________________________________   PROCEDURES  Procedure(s) performed: None  Procedures  Critical Care performed: Yes, see critical care note(s)  ____________________________________________   INITIAL IMPRESSION / ASSESSMENT AND PLAN / ED COURSE  Pertinent labs & imaging results that were available during my care of the patient were reviewed by me and considered in my medical decision making (see chart for details).  72 y.o. male with a history of pericardial effusion, now presenting with several years of progressively worsening shortness  of breath and 3 to 4 months of bilateral lower extremity edema.  Overall, the patient has hypertensive and hypoxic.  It is possible that the patient has these vital sign abnormalities and symptoms due to his pericardial effusion.  Clinically, I do not hear a rub, and the patient does not have evidence of electrographic tamponade on his EKG.  Other possible etiologies include ACS or MI although I do not see ischemia on his EKG and his troponin is 0.03 and then 0.04.  He does have a grossly elevated BNP at 4268; I do not have a previous in his record from here.  His x-ray shows moderate stable enlarged cardiac silhouette with some minimal vascular congestion.  CHF exacerbation is also possible.  PE is also considered, although the patient is not having any chest pain.  However, will get a CT to rule out pulmonary embolus.  I have had a discussion with the patient about the risk of IV contrast dye, particularly to his transplanted kidney.  He understands that while rare, it is possible to have renal failure due to IV contrast dye.  Even though he is having an acute CHF exacerbation, we will give him a 500 cc bolus for renal protection prior to the study.  ----------------------------------------- 7:49 PM on 05/05/2018 -----------------------------------------  Unfortunately, I am unable to obtain an echocardiogram at [facility]; I have called to see if we are able to get a tech to come into the hospital and that is not possible here.  I have talked to the [place] surgeon, Dr. Genia Harold, at [facility] who has referred me to the [facility] hospitalist, who is referred me to the [facility] renal physician and I am awaiting a callback from them.  The patient's CT is pending at this time and he continues to have oxygen saturations in the mid 90s on 3 L nasal cannula, is mentating normally.  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  The patient is receiving labetalol at this time and I have called [facility] for immediate transfer for treatment of this dissection.  An esmolol bolus and gtt have been ordered.  ----------------------------------------- 8:50 PM on 05/05/2018 -----------------------------------------  I have reviewed the patient's imaging results and he has a CT scan from 07/18/2017 which describes an old and stable Stanford B aortic dissections as follows: Aorta: Dilated ascending thoracic aorta measuring up to 4.7 cm in transverse dimension, previously 4.6 cm (6:82). Similar appearance of Stanford type B aortic dissection extends from the aortic arch just distal to the takeoff of the left subclavian artery into the infrarenal abdominal aorta and extending distally into the left common femoral artery.  Aneurysmal dilation of the proximal descending thoracic aorta measures 5.4 x 5.0 cm orthogonal dimensions, previously 5.3 x 5.0 cm.   This is very similar in appearance to what the radiologist is reading here.  I spoke with Dr. Derrel Nip, the radiologist who read the patient's  CT from [facility], who is attempting to locate [facility] imaging for comparison.  At this time, I have spoken with the CT surgeon, who does not do endovascular repair of thoracic and abdominal dissections; this would be vascular.  However, I think this is an old dissection without any new features and that the patient will best be served on the renal transplant team.  A consultation has been made to the [facility] renal transplant team.  ----------------------------------------- 9:00 PM on 05/05/2018 -----------------------------------------  The patient has been accepted for an ED to ED transfer.  I did give him intravenous fluids to hydrate his renal transplant prior to CT imaging, and at  this time I will give him Lasix for diuresis in the setting of his acute CHF exacerbation.  The patient will continue to be closely monitored until transfer.  The patient has had his Prograf and Myfortic ordered; asa is held despite CHF/elevated troponin until aortic dissection can be assessed at [facility].  The pt has no chest pain.  ----------------------------------------- 10:46 PM on 05/05/2018 -----------------------------------------  The patient's repeat troponin remains 0.04.  He continues to maintain O2 sats in the mid 90s on 3 to 4 L nasal cannula.  He has had good urinary output from his Lasix.  At this time, the transport team is here for the patient and he will be transferred to [facility].  I was able to speak to the patient's extended family as well as the patient about all of his results and plan of care.  CRITICAL CARE Performed by: Eula Listen   Total critical care time: 100 minutes  Critical care time was exclusive of separately billable procedures and treating other patients.  Critical care was necessary to treat or prevent imminent or life-threatening deterioration.  Critical care was time spent personally by me on the following activities: development of treatment plan with patient and/or surrogate as well as nursing, discussions with consultants, evaluation of patient's response to treatment, examination of patient, obtaining history from patient or surrogate, ordering and performing treatments and interventions, ordering and review of laboratory studies, ordering and review of radiographic studies, pulse oximetry and re-evaluation of patient's condition.   ____________________________________________  FINAL CLINICAL IMPRESSION(S) / ED DIAGNOSES  Final diagnoses:  Dissection of thoracoabdominal aorta (HCC)  Pericardial effusion  Hypoxia  Acute on chronic congestive heart failure, unspecified heart failure type [facility])         NEW MEDICATIONS STARTED DURING THIS  VISIT:  New Prescriptions   No medications on file      Eula Listen, MD 05/05/18 2034    Eula Listen, MD 05/05/18 2052    Eula Listen, MD 05/05/18 2102    Eula Listen, MD 05/05/18 2107    Eula Listen, MD 05/05/18 [id number]

## 2018-05-05 NOTE — ED Notes (Addendum)
Pt ambulated with sats of 88% on room air.

## 2018-05-06 MED ORDER — POTASSIUM CHLORIDE CRYS ER 10 MEQ PO TBCR
60.00 | EXTENDED_RELEASE_TABLET | ORAL | Status: DC
Start: 2018-05-07 — End: 2018-05-06

## 2018-05-06 MED ORDER — TACROLIMUS 1 MG PO CAPS
3.00 | ORAL_CAPSULE | ORAL | Status: DC
Start: 2018-05-07 — End: 2018-05-06

## 2018-05-06 MED ORDER — CLONIDINE 0.3 MG/24HR TD PTWK
1.00 | MEDICATED_PATCH | TRANSDERMAL | Status: DC
Start: 2018-05-12 — End: 2018-05-06

## 2018-05-06 MED ORDER — ISOSORBIDE MONONITRATE ER 30 MG PO TB24
60.00 | ORAL_TABLET | ORAL | Status: DC
Start: 2018-05-07 — End: 2018-05-06

## 2018-05-06 MED ORDER — ATORVASTATIN CALCIUM 20 MG PO TABS
20.00 | ORAL_TABLET | ORAL | Status: DC
Start: 2018-05-07 — End: 2018-05-06

## 2018-05-06 MED ORDER — LISINOPRIL 40 MG PO TABS
40.00 | ORAL_TABLET | ORAL | Status: DC
Start: 2018-05-07 — End: 2018-05-06

## 2018-05-06 MED ORDER — DOCUSATE SODIUM 100 MG PO CAPS
100.00 | ORAL_CAPSULE | ORAL | Status: DC
Start: 2018-05-07 — End: 2018-05-06

## 2018-05-06 MED ORDER — MYCOPHENOLATE SODIUM 360 MG PO TBEC
360.00 | DELAYED_RELEASE_TABLET | ORAL | Status: DC
Start: 2018-05-07 — End: 2018-05-06

## 2018-05-06 MED ORDER — LABETALOL HCL 100 MG PO TABS
100.00 | ORAL_TABLET | ORAL | Status: DC
Start: 2018-05-07 — End: 2018-05-06

## 2018-05-06 MED ORDER — ASPIRIN 81 MG PO CHEW
81.00 | CHEWABLE_TABLET | ORAL | Status: DC
Start: 2018-05-08 — End: 2018-05-06

## 2018-05-06 MED ORDER — POTASSIUM CHLORIDE 20 MEQ PO PACK
40.00 | PACK | ORAL | Status: DC
Start: 2018-05-06 — End: 2018-05-06

## 2018-05-06 MED ORDER — HYDRALAZINE HCL 100 MG PO TABS
100.00 | ORAL_TABLET | ORAL | Status: DC
Start: 2018-05-07 — End: 2018-05-06

## 2018-05-07 MED ORDER — ACETAMINOPHEN 325 MG PO TABS
650.00 | ORAL_TABLET | ORAL | Status: DC
Start: ? — End: 2018-05-07

## 2018-05-07 MED ORDER — GENERIC EXTERNAL MEDICATION
1.00 | Status: DC
Start: ? — End: 2018-05-07

## 2018-05-07 MED ORDER — FUROSEMIDE 10 MG/ML IJ SOLN
40.00 | INTRAMUSCULAR | Status: DC
Start: 2018-05-07 — End: 2018-05-07

## 2018-05-07 MED ORDER — INFLUENZA VAC SPLIT QUAD 0.5 ML IM SUSY
0.50 | PREFILLED_SYRINGE | INTRAMUSCULAR | Status: DC
Start: ? — End: 2018-05-07

## 2018-05-19 ENCOUNTER — Ambulatory Visit: Payer: Medicare Other | Admitting: Cardiothoracic Surgery

## 2020-01-12 IMAGING — CR DG CHEST 2V
2 series · 2 of 2 positions shown · non-contrast
Comparison: 03/20/2007

CLINICAL DATA: Shortness of breath one month.  Kidney transplant.

EXAM:
CHEST - 2 VIEW

[chest pa]
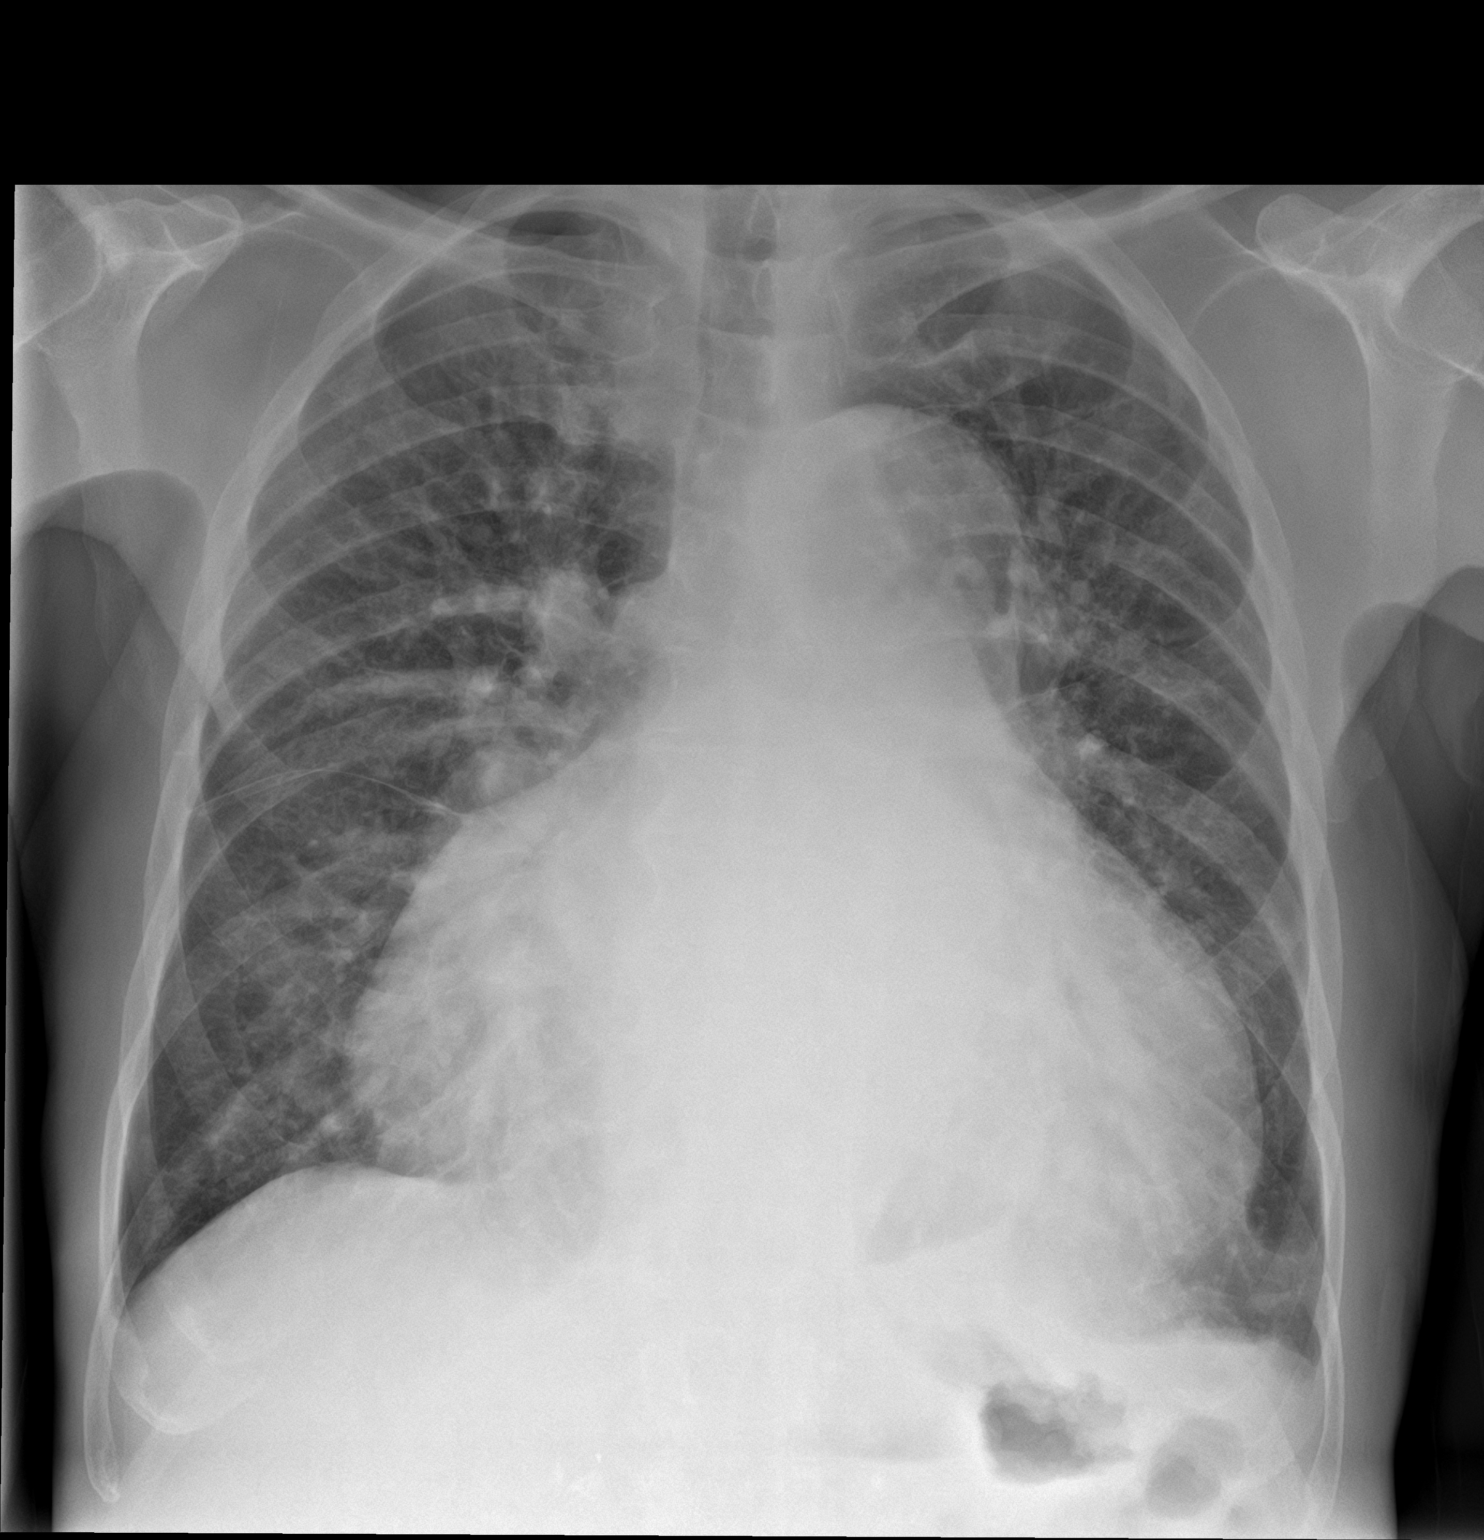

[chest lat]
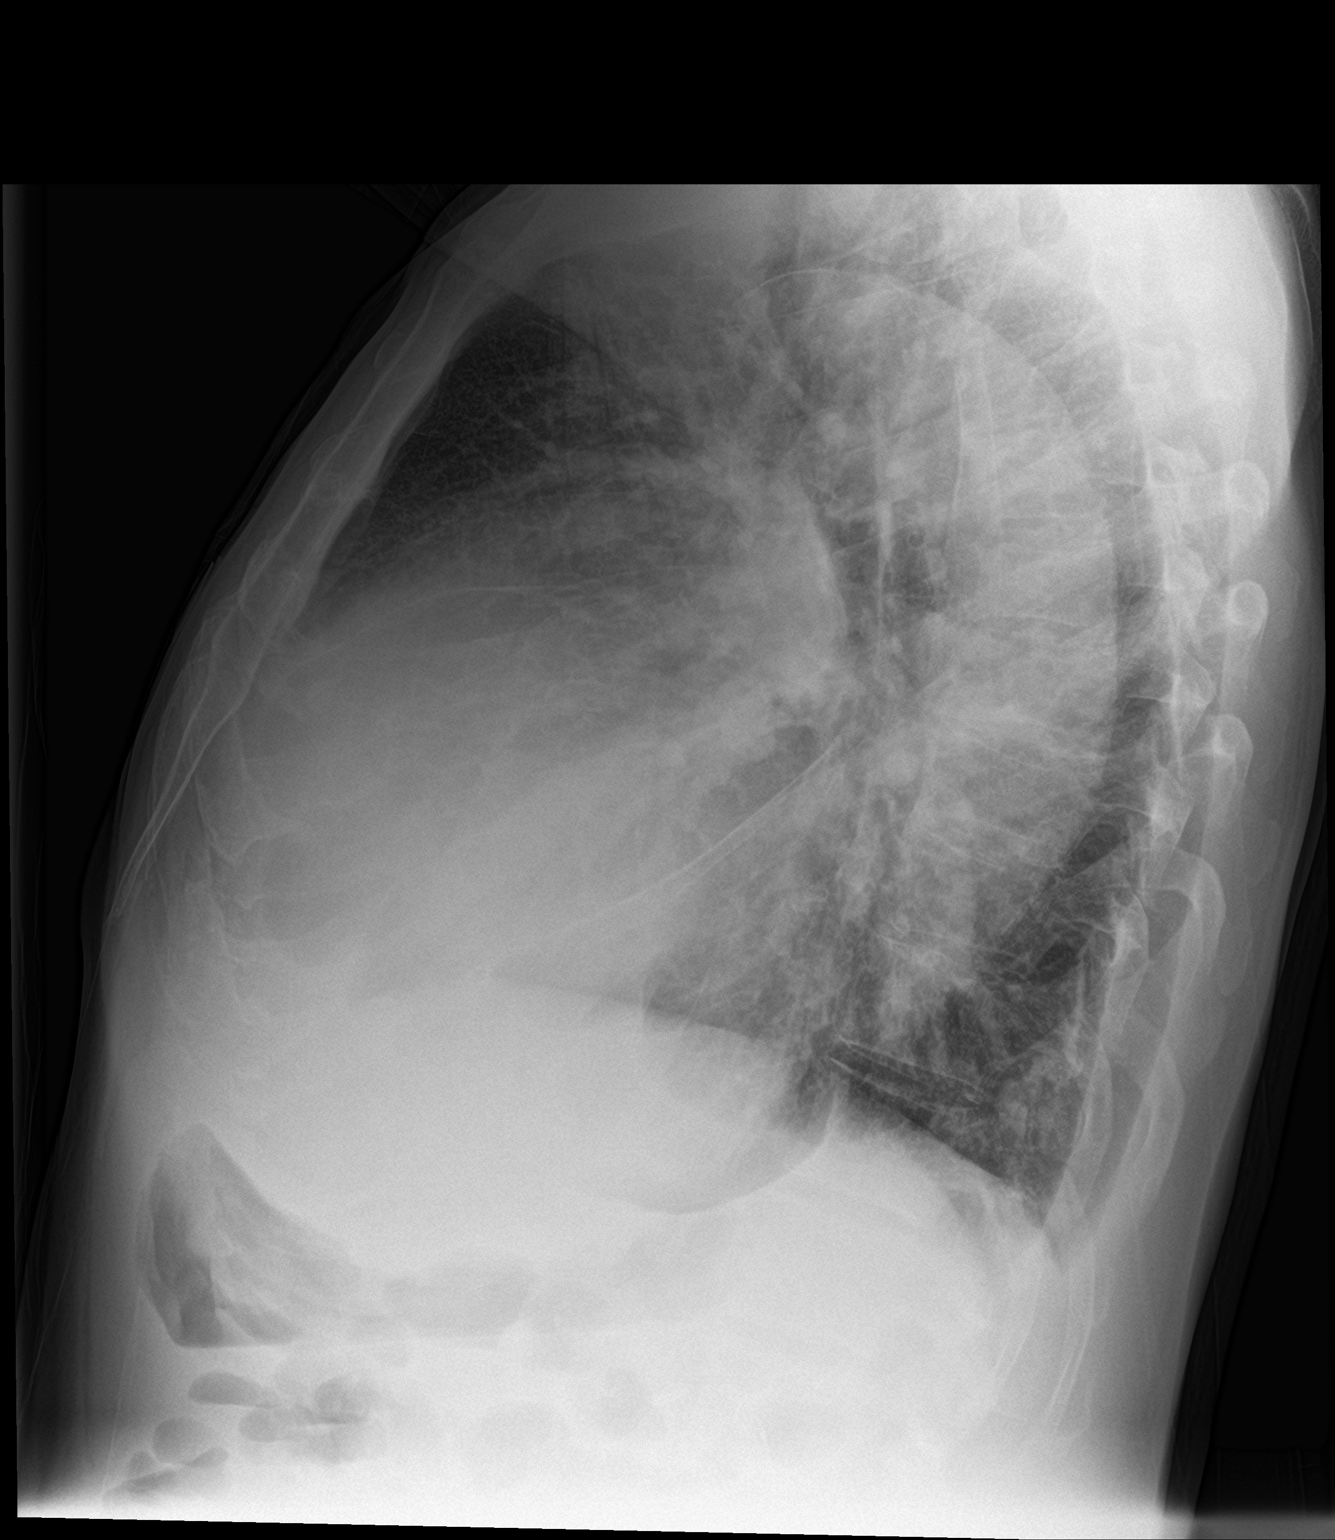

[2 of 2 positions shown; findings below may reference images not displayed]

FINDINGS: Lungs are adequately inflated without lobar consolidation or
effusion. Mild prominence of the perihilar markings. Moderate stable
cardiomegaly. Remainder of the exam is unchanged.
IMPRESSION: Moderate stable cardiomegaly with minimal vascular congestion.

## 2024-04-06 NOTE — Discharge Instructions (Signed)

## 2024-04-07 ENCOUNTER — Encounter: Payer: Self-pay | Admitting: Ophthalmology

## 2024-04-08 ENCOUNTER — Ambulatory Visit
Admission: RE | Admit: 2024-04-08 | Discharge: 2024-04-08 | Disposition: A | Discharge status: Home / Self Care | Attending: Ophthalmology | Admitting: Ophthalmology

## 2024-04-08 ENCOUNTER — Encounter: Payer: Self-pay | Admitting: Anesthesiology

## 2024-04-08 ENCOUNTER — Encounter
Admission: RE | Disposition: A | Discharge status: Home / Self Care | Payer: Self-pay | Source: Home / Self Care | Attending: Ophthalmology

## 2024-04-08 ENCOUNTER — Ambulatory Visit: Payer: Self-pay | Admitting: Anesthesiology

## 2024-04-08 ENCOUNTER — Other Ambulatory Visit: Payer: Self-pay

## 2024-04-08 ENCOUNTER — Encounter: Payer: Self-pay | Admitting: Ophthalmology

## 2024-04-08 DIAGNOSIS — H2511 Age-related nuclear cataract, right eye: Secondary | ICD-10-CM | POA: Insufficient documentation

## 2024-04-08 DIAGNOSIS — I252 Old myocardial infarction: Secondary | ICD-10-CM | POA: Insufficient documentation

## 2024-04-08 DIAGNOSIS — I129 Hypertensive chronic kidney disease with stage 1 through stage 4 chronic kidney disease, or unspecified chronic kidney disease: Secondary | ICD-10-CM | POA: Insufficient documentation

## 2024-04-08 DIAGNOSIS — I2510 Atherosclerotic heart disease of native coronary artery without angina pectoris: Secondary | ICD-10-CM | POA: Diagnosis not present

## 2024-04-08 DIAGNOSIS — Z940 Kidney transplant status: Secondary | ICD-10-CM | POA: Diagnosis not present

## 2024-04-08 DIAGNOSIS — D631 Anemia in chronic kidney disease: Secondary | ICD-10-CM | POA: Diagnosis not present

## 2024-04-08 DIAGNOSIS — N189 Chronic kidney disease, unspecified: Secondary | ICD-10-CM | POA: Insufficient documentation

## 2024-04-08 DIAGNOSIS — G4730 Sleep apnea, unspecified: Secondary | ICD-10-CM | POA: Diagnosis not present

## 2024-04-08 HISTORY — PX: CATARACT EXTRACTION W/PHACO: SHX586

## 2024-04-08 HISTORY — DX: Sleep apnea, unspecified: G47.30

## 2024-04-08 HISTORY — DX: Unspecified osteoarthritis, unspecified site: M19.90

## 2024-04-08 HISTORY — DX: Malignant (primary) neoplasm, unspecified: C80.1

## 2024-04-08 SURGERY — PHACOEMULSIFICATION, CATARACT, WITH IOL INSERTION
Anesthesia: Monitor Anesthesia Care | Site: Eye | Laterality: Right

## 2024-04-08 MED ORDER — FENTANYL CITRATE (PF) 100 MCG/2ML IJ SOLN
INTRAMUSCULAR | Status: DC | PRN
  Administered 2024-04-08 (×2): 50 ug via INTRAVENOUS

## 2024-04-08 MED ORDER — LIDOCAINE HCL (PF) 2 % IJ SOLN
INTRAOCULAR | Status: DC | PRN
  Administered 2024-04-08: 2 mL

## 2024-04-08 MED ORDER — MIDAZOLAM HCL 2 MG/2ML IJ SOLN
INTRAMUSCULAR | Status: DC | PRN
  Administered 2024-04-08 (×2): 1 mg via INTRAVENOUS

## 2024-04-08 MED ORDER — SIGHTPATH DOSE#1 BSS IO SOLN
INTRAOCULAR | Status: DC | PRN
  Administered 2024-04-08: 15 mL via INTRAOCULAR

## 2024-04-08 MED ORDER — MIDAZOLAM HCL 2 MG/2ML IJ SOLN
INTRAMUSCULAR | Status: AC
  Filled 2024-04-08: qty 2

## 2024-04-08 MED ORDER — SIGHTPATH DOSE#1 BSS IO SOLN
INTRAOCULAR | Status: DC | PRN
  Administered 2024-04-08: 69 mL via OPHTHALMIC

## 2024-04-08 MED ORDER — ARMC OPHTHALMIC DILATING DROPS
1.0000 | OPHTHALMIC | Status: DC | PRN
  Administered 2024-04-08 (×3): 1 via OPHTHALMIC

## 2024-04-08 MED ORDER — TETRACAINE HCL 0.5 % OP SOLN
OPHTHALMIC | Status: AC
  Filled 2024-04-08: qty 4

## 2024-04-08 MED ORDER — SIGHTPATH DOSE#1 NA HYALUR & NA CHOND-NA HYALUR IO KIT
PACK | INTRAOCULAR | Status: DC | PRN
  Administered 2024-04-08: 1 via OPHTHALMIC

## 2024-04-08 MED ORDER — FENTANYL CITRATE (PF) 100 MCG/2ML IJ SOLN
INTRAMUSCULAR | Status: AC
  Filled 2024-04-08: qty 2

## 2024-04-08 MED ORDER — TETRACAINE HCL 0.5 % OP SOLN
1.0000 [drp] | OPHTHALMIC | Status: DC | PRN
  Administered 2024-04-08 (×3): 1 [drp] via OPHTHALMIC

## 2024-04-08 MED ORDER — BRIMONIDINE TARTRATE-TIMOLOL 0.2-0.5 % OP SOLN
OPHTHALMIC | Status: DC | PRN
  Administered 2024-04-08: 1 [drp] via OPHTHALMIC

## 2024-04-08 MED ORDER — ARMC OPHTHALMIC DILATING DROPS
OPHTHALMIC | Status: AC
  Filled 2024-04-08: qty 0.5

## 2024-04-08 MED ORDER — CEFUROXIME OPHTHALMIC INJECTION 1 MG/0.1 ML
INJECTION | OPHTHALMIC | Status: DC | PRN
Start: 2024-04-08 — End: 2024-04-08
  Administered 2024-04-08: .1 mL via INTRACAMERAL

## 2024-04-08 SURGICAL SUPPLY — 9 items
FEE CATARACT SUITE SIGHTPATH (MISCELLANEOUS) ×1 IMPLANT
GLOVE BIOGEL PI IND STRL 8 (GLOVE) ×1 IMPLANT
GLOVE SURG LX STRL 7.5 STRW (GLOVE) ×1 IMPLANT
GLOVE SURG SYN 6.5 PF PI BL (GLOVE) ×1 IMPLANT
LENS IOL CLRN WGN WHL 17.5 (Intraocular Lens) IMPLANT
NDL FILTER BLUNT 18X1 1/2 (NEEDLE) ×1 IMPLANT
NEEDLE FILTER BLUNT 18X1 1/2 (NEEDLE) ×1 IMPLANT
RING MALYGIN 7.0 (MISCELLANEOUS) IMPLANT
SYR 3ML LL SCALE MARK (SYRINGE) ×1 IMPLANT

## 2024-04-08 NOTE — Anesthesia Postprocedure Evaluation (Signed)
 Anesthesia Post Note  Patient: Jay Mora  Procedure(s) Performed: PHACOEMULSIFICATION, CATARACT, WITH IOL INSERTION 3.73 00:19.6 (Right: Eye)  Patient location during evaluation: PACU Anesthesia Type: MAC Level of consciousness: awake and alert Pain management: pain level controlled Vital Signs Assessment: post-procedure vital signs reviewed and stable Respiratory status: spontaneous breathing, nonlabored ventilation, respiratory function stable and patient connected to nasal cannula oxygen Cardiovascular status: stable and blood pressure returned to baseline Postop Assessment: no apparent nausea or vomiting Anesthetic complications: no   No notable events documented.   Last Vitals:  Vitals:   04/08/24 1250 04/08/24 1256  BP: 109/77 126/71  Pulse: (!) 58 60  Resp: 12 10  Temp: 36.5 C 36.5 C  SpO2: 97% 96%    Last Pain:  Vitals:   04/08/24 1256  TempSrc:   PainSc: 0-No pain                 Jay Mora

## 2024-04-08 NOTE — Transfer of Care (Signed)
 Immediate Anesthesia Transfer of Care Note  Patient: Jay Mora  Procedure(s) Performed: PHACOEMULSIFICATION, CATARACT, WITH IOL INSERTION 3.73 00:19.6 (Right: Eye)  Patient Location: PACU  Anesthesia Type: MAC  Level of Consciousness: awake, alert  and patient cooperative  Airway and Oxygen Therapy: Patient Spontanous Breathing and Patient connected to supplemental oxygen  Post-op Assessment: Post-op Vital signs reviewed, Patient's Cardiovascular Status Stable, Respiratory Function Stable, Patent Airway and No signs of Nausea or vomiting  Post-op Vital Signs: Reviewed and stable  Complications: No notable events documented.

## 2024-04-08 NOTE — Op Note (Signed)
 OPERATIVE NOTE  Jay Mora [id number] 04/08/2024   PREOPERATIVE DIAGNOSIS:    Nuclear Sclerotic Cataract Right eye with miotic pupil.        H25.11  POSTOPERATIVE DIAGNOSIS: Nuclear Sclerotic Cataract Right eye with miotic pupil.          PROCEDURE:  Phacoemusification with posterior chamber intraocular lens placement of the right eye which required pupil stretching with the Malyugin pupil expansion device. Ultrasound time: Procedure(s): PHACOEMULSIFICATION, CATARACT, WITH IOL INSERTION 3.73 00:19.6 (Right)  LENS:   Implant Name Type Inv. Item Serial No. Manufacturer Lot No. LRB No. Used Action  LENS IOL CLRN WGN WHL 17.5 - [id number] Intraocular Lens LENS IOL CLRN WGN WHL 17.5 [id number] [employer]  Right 1 Implanted   [id number]  SURGEON:  Dene FABIENE Etienne, MD   ANESTHESIA:  Topical with tetracaine  drops and 2% Xylocaine  jelly, augmented with 1% preservative-free intracameral lidocaine .   COMPLICATIONS:  None.   DESCRIPTION OF PROCEDURE:  The patient was identified in the holding room and transported to the operating room and placed in the supine position under the operating microscope. The right eye was identified as the operative eye and it was prepped and draped in the usual sterile ophthalmic fashion.   A 1 millimeter clear-corneal paracentesis was made at the 12:00 position.  0.5 ml of preservative-free 1% lidocaine  was injected into the anterior chamber. The anterior chamber was filled with Viscoat viscoelastic.  A 2.4 millimeter keratome was used to make a near-clear corneal incision at the 9:00 position. A Malyugin pupil expander was then placed through the main incision and into the anterior chamber of the eye.  The edge of the iris was secured on the lip of the pupil expander and it was released, thereby expanding the pupil to approximately 7 millimeters for completion of the cataract surgery.  Additional Viscoat was placed in the anterior chamber.  A cystotome and  capsulorrhexis forceps were used to make a curvilinear capsulorrhexis. There was a radial tear at the 7:00 position.  This did not extend during the case.  Balanced salt solution was used to hydrodissect and hydrodelineate the lens nucleus.   Phacoemulsification was used in stop and chop fashion to remove the lens, nucleus and epinucleus.  The remaining cortex was aspirated using the irrigation aspiration handpiece.  Additional Provisc was placed into the eye to distend the capsular bag for lens placement.  A lens was then injected into the capsular bag.  The pupil expanding ring was removed using a Kuglen hook and insertion device. The remaining viscoelastic was aspirated from the capsular bag and the anterior chamber.  The anterior chamber was filled with balanced salt solution to inflate to a physiologic pressure.  Wounds were hydrated with balanced salt solution.  The anterior chamber was inflated to a physiologic pressure with balanced salt solution.  No wound leaks were noted.Cefuroxime  0.1 ml of a 10mg /ml solution was injected into the anterior chamber for a dose of 1 mg of intracameral antibiotic at the completion of the case. Timolol  and Brimonidine  drops were applied to the eye.  The patient was taken to the recovery room in stable condition without complications of anesthesia or surgery.  [name] 04/08/2024, 12:49 PM

## 2024-04-08 NOTE — H&P (Signed)
 [facility]   Primary Care Physician:  Sadie Manna, MD Ophthalmologist: Dr. Dene Etienne  Pre-Procedure History & Physical: HPI:  Jay Mora is a 78 y.o. male here for ophthalmic surgery.   Past Medical History:  Diagnosis Date   Arthritis    Cancer (HCC)    prostate   Chronic kidney disease    kidney transplant right   Hyperlipidemia    Hypertension    Kidney transplanted    Sleep apnea    USES CPAP    Past Surgical History:  Procedure Laterality Date   NEPHRECTOMY TRANSPLANTED ORGAN      Prior to Admission medications   Medication Sig Start Date End Date Taking? Authorizing Provider  albuterol (PROVENTIL HFA;VENTOLIN HFA) 108 (90 Base) MCG/ACT inhaler Inhale into the lungs. 12/03/16  Yes [provider]  amLODipine (NORVASC) 10 MG tablet Take 10 mg by mouth daily.   Yes [provider]  aspirin  EC 81 MG tablet Take by mouth. 12/10/13  Yes [provider]  atorvastatin  (LIPITOR) 20 MG tablet Take by mouth.   Yes [provider]  carvedilol (COREG) 6.25 MG tablet Take 6.25 mg by mouth 2 (two) times daily with a meal.   Yes [provider]  Cholecalciferol 1000 units capsule Take by mouth.   Yes [provider]  cloNIDine  (CATAPRES  - DOSED IN MG/24 HR) 0.3 mg/24hr patch Place onto the skin.   Yes [provider]  cyanocobalamin 100 MCG tablet Take 100 mcg by mouth daily.   Yes [provider]  docusate sodium  (COLACE) 100 MG capsule Take by mouth.   Yes [provider]  hydrALAZINE  (APRESOLINE ) 100 MG tablet Take by mouth. 12/13/13  Yes [provider]  labetalol  (NORMODYNE ) 100 MG tablet Take by mouth. 04/25/11  Yes [provider]  lisinopril  (PRINIVIL ,ZESTRIL ) 40 MG tablet Take by mouth.   Yes [provider]  magnesium 30 MG tablet Take 30 mg by mouth 2 (two) times daily.   Yes [provider]  mycophenolate  (MYFORTIC ) 180 MG EC  tablet Take by mouth.   Yes [provider]  polyethylene glycol powder (GLYCOLAX/MIRALAX) powder Take by mouth. 12/13/13  Yes [provider]  potassium chloride  SA (K-DUR,KLOR-CON ) 20 MEQ tablet Take by mouth. 07/01/16  Yes [provider]  spironolactone (ALDACTONE) 100 MG tablet Take 100 mg by mouth daily.   Yes [provider]  tacrolimus  (PROGRAF ) 1 MG capsule Take by mouth. 12/28/15  Yes [provider]  torsemide (DEMADEX) 20 MG tablet Take 20 mg by mouth daily.   Yes [provider]  ferrous sulfate 325 (65 FE) MG tablet Take by mouth. 01/15/18 01/15/19  [provider]  isosorbide  mononitrate (IMDUR ) 60 MG 24 hr tablet Take by mouth. 06/03/12   [provider]    Allergies as of 03/10/2024 - Review Complete 05/05/2018  Allergen Reaction Noted   Shellfish allergy Swelling 04/30/2013   Minoxidil Nausea Only, Other (See Comments), and Rash 09/29/2012    History reviewed. No pertinent family history.  Social History   Socioeconomic History   Marital status: Married    Spouse name: Not on file   Number of children: Not on file   Years of education: Not on file   Highest education level: Not on file  Occupational History   Not on file  Tobacco Use   Smoking status: Never   Smokeless tobacco: Never  Vaping Use   Vaping status: Never Used  Substance  and Sexual Activity   Alcohol use: Not Currently   Drug use: Never   Sexual activity: Not on file  Other Topics Concern   Not on file  Social History Narrative   Not on file   Social Drivers of Health   Financial Resource Strain: Low Risk  (02/18/2023)   Received from [facility]   Overall Financial Resource Strain (CARDIA)    Difficulty of Paying Living Expenses: Not hard at all  Food Insecurity: No Food Insecurity (02/18/2023)   Received from [facility]   Hunger Vital Sign    Within the past 12 months, you worried that  your food would run out before you got the money to buy more.: Never true    Within the past 12 months, the food you bought just didn't last and you didn't have money to get more.: Never true  Transportation Needs: No Transportation Needs (02/18/2023)   Received from [facility] - Transportation    In the past 12 months, has lack of transportation kept you from medical appointments or from getting medications?: No    Lack of Transportation (Non-Medical): No  Physical Activity: Not on file  Stress: Not on file  Social Connections: Not on file  Intimate Partner Violence: Not on file    Review of Systems: See HPI, otherwise negative ROS  Physical Exam: BP 126/74   Pulse 66   Temp 98.5 F (36.9 C) (Temporal)   Resp (!) 21   Ht 5' 10 (1.778 m)   Wt 83.9 kg   SpO2 93%   BMI 26.54 kg/m  General:   Alert,  pleasant and cooperative in NAD Head:  Normocephalic and atraumatic. Lungs:  Clear to auscultation.    Heart:  Regular rate and rhythm.   Impression/Plan: Jay Mora is here for ophthalmic surgery.  Risks, benefits, limitations, and alternatives regarding ophthalmic surgery have been reviewed with the patient.  Questions have been answered.  All parties agreeable.   MITTIE GASKIN, MD  04/08/2024, 11:11 AM

## 2024-04-08 NOTE — Anesthesia Preprocedure Evaluation (Addendum)
 Anesthesia Evaluation  Patient identified by MRN, date of birth, ID band Patient awake    Reviewed: Allergy & Precautions, NPO status , Patient's Chart, lab work & pertinent test results  History of Anesthesia Complications Negative for: history of anesthetic complications  Airway Mallampati: III  TM Distance: >3 FB Neck ROM: full    Dental  (+) Partial Lower, Partial Upper   Pulmonary sleep apnea and Continuous Positive Airway Pressure Ventilation    Pulmonary exam normal        Cardiovascular hypertension, On Medications + CAD and + Past MI  Normal cardiovascular exam     Neuro/Psych negative neurological ROS  negative psych ROS   GI/Hepatic negative GI ROS, Neg liver ROS,,,  Endo/Other  negative endocrine ROS    Renal/GU Renal disease     Musculoskeletal  (+) Arthritis ,    Abdominal   Peds  Hematology  (+) Blood dyscrasia, anemia   Anesthesia Other Findings Past Medical History: No date: Arthritis No date: Cancer (HCC)     Comment:  prostate No date: Chronic kidney disease     Comment:  kidney transplant right No date: Hyperlipidemia No date: Hypertension No date: Kidney transplanted No date: Sleep apnea     Comment:  USES CPAP  Past Surgical History: No date: NEPHRECTOMY TRANSPLANTED ORGAN  BMI    Body Mass Index: 27.26 kg/m      Reproductive/Obstetrics negative OB ROS                              Anesthesia Physical Anesthesia Plan  ASA: 3  Anesthesia Plan: MAC   Post-op Pain Management: Minimal or no pain anticipated   Induction: Intravenous  PONV Risk Score and Plan: 1  Airway Management Planned: Natural Airway and Nasal Cannula  Additional Equipment:   Intra-op Plan:   Post-operative Plan:   Informed Consent: I have reviewed the patients History and Physical, chart, labs and discussed the procedure including the risks, benefits and alternatives  for the proposed anesthesia with the patient or authorized representative who has indicated his/her understanding and acceptance.     Dental Advisory Given  Plan Discussed with: Anesthesiologist, CRNA and Surgeon  Anesthesia Plan Comments: (Patient consented for risks of anesthesia including but not limited to:  - adverse reactions to medications - damage to eyes, teeth, lips or other oral mucosa - nerve damage due to positioning  - sore throat or hoarseness - Damage to heart, brain, nerves, lungs, other parts of body or loss of life  Patient voiced understanding and assent.)         Anesthesia Quick Evaluation

## 2024-04-09 ENCOUNTER — Encounter: Payer: Self-pay | Admitting: Ophthalmology

## 2024-04-23 ENCOUNTER — Encounter: Payer: Self-pay | Admitting: Anesthesiology

## 2024-05-13 ENCOUNTER — Ambulatory Visit: Admission: RE | Admit: 2024-05-13 | Source: Home / Self Care | Admitting: Ophthalmology

## 2024-05-13 SURGERY — PHACOEMULSIFICATION, CATARACT, WITH IOL INSERTION
Anesthesia: Topical | Laterality: Left

## 2024-10-06 ENCOUNTER — Ambulatory Visit: Admit: 2024-10-06 | Admitting: Internal Medicine
